# Patient Record
Sex: Female | Born: 1990 | Race: White | Hispanic: No | Marital: Single
Health system: Southern US, Community
[De-identification: ages and names within clinical notes are randomized; demographics above are authoritative.]

---

## 2013-08-29 ENCOUNTER — Other Ambulatory Visit: Payer: Self-pay | Admitting: Physician Assistant

## 2013-08-29 DIAGNOSIS — N6452 Nipple discharge: Secondary | ICD-10-CM

## 2013-09-03 ENCOUNTER — Ambulatory Visit
Admission: RE | Admit: 2013-09-03 | Discharge: 2013-09-03 | Disposition: A | Discharge status: Home / Self Care | Payer: BC Managed Care – PPO | Source: Ambulatory Visit | Attending: Physician Assistant | Admitting: Physician Assistant

## 2013-09-03 DIAGNOSIS — N6452 Nipple discharge: Secondary | ICD-10-CM

## 2015-04-09 ENCOUNTER — Other Ambulatory Visit: Payer: Self-pay | Admitting: Family Medicine

## 2015-04-09 DIAGNOSIS — N6452 Nipple discharge: Secondary | ICD-10-CM

## 2015-04-14 ENCOUNTER — Ambulatory Visit
Admission: RE | Admit: 2015-04-14 | Discharge: 2015-04-14 | Disposition: A | Discharge status: Home / Self Care | Payer: BLUE CROSS/BLUE SHIELD | Source: Ambulatory Visit | Attending: Family Medicine | Admitting: Family Medicine

## 2015-04-14 DIAGNOSIS — N6452 Nipple discharge: Secondary | ICD-10-CM

## 2016-04-27 ENCOUNTER — Telehealth: Payer: Self-pay | Admitting: Allergy and Immunology

## 2016-04-27 NOTE — Telephone Encounter (Signed)
Spoke with patient she wanted to know how to interpret results advised

## 2016-04-27 NOTE — Telephone Encounter (Signed)
Patient called about results she got, and she doesn't understand them. Would like to speak to a nurse.
